# Patient Record
Sex: Male | Born: 1960 | Race: White | Hispanic: No | Marital: Married
Health system: Southern US, Community
[De-identification: ages and names within clinical notes are randomized; demographics above are authoritative.]

---

## 2018-07-24 ENCOUNTER — Other Ambulatory Visit (HOSPITAL_COMMUNITY): Payer: Self-pay | Admitting: Urology

## 2018-07-24 DIAGNOSIS — R972 Elevated prostate specific antigen [PSA]: Secondary | ICD-10-CM

## 2018-08-04 ENCOUNTER — Ambulatory Visit (HOSPITAL_COMMUNITY)
Admission: RE | Admit: 2018-08-04 | Discharge: 2018-08-04 | Disposition: A | Discharge status: Home / Self Care | Payer: BC Managed Care – PPO | Source: Ambulatory Visit | Attending: Urology | Admitting: Urology

## 2018-08-04 ENCOUNTER — Encounter (HOSPITAL_COMMUNITY): Payer: Self-pay

## 2018-08-04 DIAGNOSIS — R972 Elevated prostate specific antigen [PSA]: Secondary | ICD-10-CM

## 2018-08-04 LAB — POCT I-STAT CREATININE: CREATININE: 1.1 mg/dL (ref 0.61–1.24)

## 2018-08-04 MED ORDER — LIDOCAINE HCL URETHRAL/MUCOSAL 2 % EX GEL
CUTANEOUS | Status: AC
  Filled 2018-08-04: qty 30

## 2018-08-04 MED ORDER — GADOBUTROL 1 MMOL/ML IV SOLN: 10.0000 mL | Freq: Once | INTRAVENOUS | Status: DC | PRN

## 2018-08-04 MED ORDER — LIDOCAINE HCL URETHRAL/MUCOSAL 2 % EX GEL: 1.0000 "application " | Freq: Once | CUTANEOUS | Status: DC

## 2018-08-04 NOTE — Progress Notes (Signed)
Attempted patient for Endo Rectal Prostate MRI, we were unable to advance EndoRectal Coil where it needed to be for MRI. Per Dr Reche Dixonalbot, reschedule patient to be done [facility] without End[facility] Coil. Instructions were given to patient and patients wife.  Please call patient with new appointment,  BHJ

## 2018-08-05 ENCOUNTER — Inpatient Hospital Stay (HOSPITAL_COMMUNITY)
Admission: RE | Admit: 2018-08-05 | Discharge: 2018-08-05 | Disposition: A | Discharge status: Home / Self Care | Payer: Self-pay | Source: Ambulatory Visit | Attending: Urology | Admitting: Urology

## 2018-08-22 ENCOUNTER — Ambulatory Visit (HOSPITAL_COMMUNITY)
Admission: RE | Admit: 2018-08-22 | Discharge: 2018-08-22 | Disposition: A | Discharge status: Home / Self Care | Payer: BC Managed Care – PPO | Source: Ambulatory Visit | Attending: Urology | Admitting: Urology

## 2018-08-22 DIAGNOSIS — R972 Elevated prostate specific antigen [PSA]: Secondary | ICD-10-CM | POA: Diagnosis not present

## 2018-08-22 DIAGNOSIS — N429 Disorder of prostate, unspecified: Secondary | ICD-10-CM | POA: Diagnosis not present

## 2018-08-22 MED ORDER — GADOBUTROL 1 MMOL/ML IV SOLN
10.0000 mL | Freq: Once | INTRAVENOUS | Status: AC | PRN
  Administered 2018-08-22: 9 mL via INTRAVENOUS

## 2018-08-22 MED ORDER — LIDOCAINE HCL URETHRAL/MUCOSAL 2 % EX GEL
CUTANEOUS | Status: AC
  Filled 2018-08-22: qty 30

## 2021-07-06 ENCOUNTER — Other Ambulatory Visit: Payer: Self-pay | Admitting: Urology

## 2021-07-06 DIAGNOSIS — R972 Elevated prostate specific antigen [PSA]: Secondary | ICD-10-CM

## 2021-07-29 ENCOUNTER — Ambulatory Visit
Admission: RE | Admit: 2021-07-29 | Discharge: 2021-07-29 | Disposition: A | Discharge status: Home / Self Care | Payer: BC Managed Care – PPO | Source: Ambulatory Visit | Attending: Urology | Admitting: Urology

## 2021-07-29 ENCOUNTER — Other Ambulatory Visit: Payer: Self-pay

## 2021-07-29 DIAGNOSIS — R972 Elevated prostate specific antigen [PSA]: Secondary | ICD-10-CM

## 2021-07-29 MED ORDER — GADOBENATE DIMEGLUMINE 529 MG/ML IV SOLN
17.0000 mL | Freq: Once | INTRAVENOUS | Status: AC | PRN
  Administered 2021-07-29: 17 mL via INTRAVENOUS

## 2022-06-16 IMAGING — MR MR PROSTATE WO/W CM
12 series · 48 of 48 positions shown · IV contrast (multihance)
Comparison: Imaging from August 22, 2018.

CLINICAL DATA: A 60-year-old male with history of elevated PSA of
7.5. Previous biopsy reportedly in 5593 showing no evidence of
neoplasm with post inflammatory changes.

EXAM:
MR PROSTATE WITHOUT AND WITH CONTRAST
TECHNIQUE: Multiplanar multisequence MRI images were obtained of the pelvis
centered about the prostate. Pre and post contrast images were
obtained.
CONTRAST:  17mL MULTIHANCE GADOBENATE DIMEGLUMINE 529 MG/ML IV SOLN

[Series 3: T2 · coronal · 3.0mm · 0.56mm/px · 1 of 25 slices shown (1 of 3)]
[im 1/25]
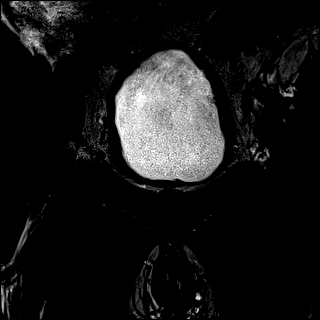

[Series 4: T1 · axial · 5.0mm · 1.25mm/px · 1 of 88 slices shown]
[im 1/88]
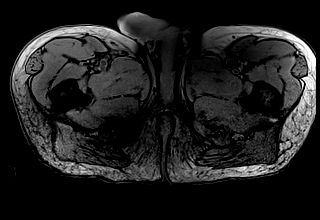

[Series 5: DWI · axial · 3.0mm · 1.75mm/px · 1 of 75 slices shown (1 of 3)]
[im 1/75]
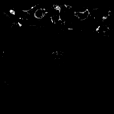

[Series 6: DWI · axial · 3.0mm · 1.75mm/px · 1 of 25 slices shown (2 of 3)]
[im 1/25]
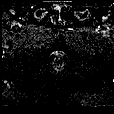

[Series 7: DWI · axial · 3.0mm · 1.75mm/px · 1 of 24 slices shown (3 of 3)]
[im 1/24]
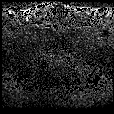

[Series 8: T2 · axial · 3.0mm · 0.56mm/px · 1 of 23 slices shown (2 of 3)]
[im 1/23]
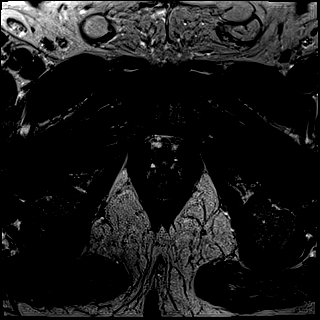

[Series 9: T2 · axial · 1.0mm · 1.04mm/px · z∈[-17,+54]mm · 2 of 72 slices shown (3 of 3)]
[im 1/72]
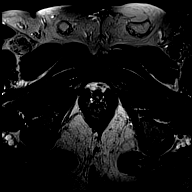
[im 72/72]
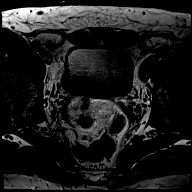

[Series 10: pre t1_twist_tra_dyn · axial · non-contrast · 3.5mm · 0.83mm/px · 1 of 20 slices shown]
[im 1/20]
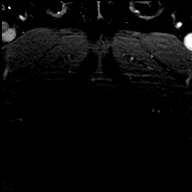

[Series 11: post t1_twist_tra_dyn-copy center · axial · non-contrast · 3.5mm · 0.83mm/px · z∈[-15,+52]mm · 17 of 600 slices shown]
[im 1/600]
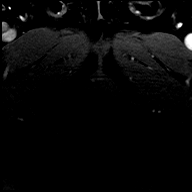
[im 38/600]
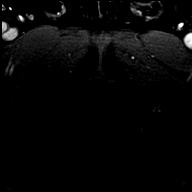
[im 75/600]
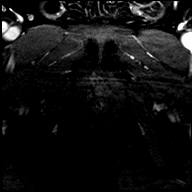
[im 113/600]
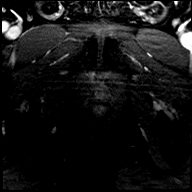
[im 150/600]
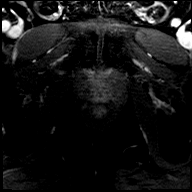
[im 188/600]
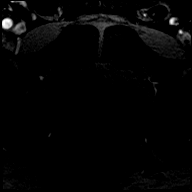
[im 225/600]
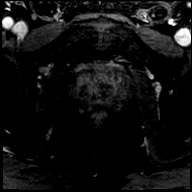
[im 263/600]
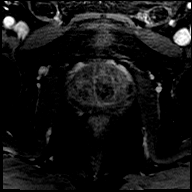
[im 300/600]
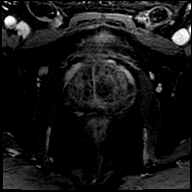
[im 337/600]
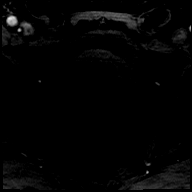
[im 375/600]
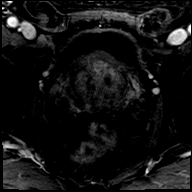
[im 412/600]
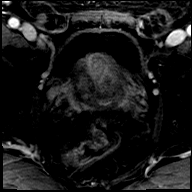
[im 450/600]
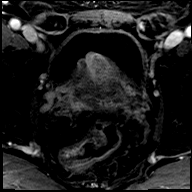
[im 487/600]
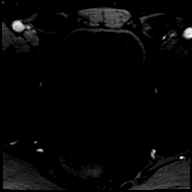
[im 525/600]
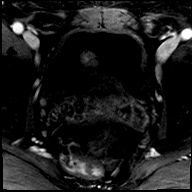
[im 562/600]
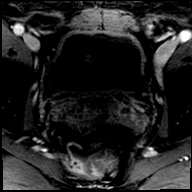
[im 600/600]
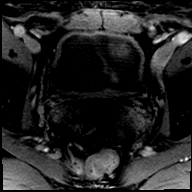

[Series 12: post t1_twist_tra_dyn-copy cent_sub · axial · 3.5mm · 0.83mm/px · z∈[-15,+52]mm · 16 of 576 slices shown]
[im 1/576]
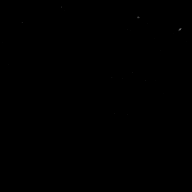
[im 39/576]
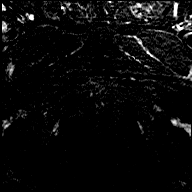
[im 77/576]
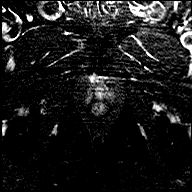
[im 116/576]
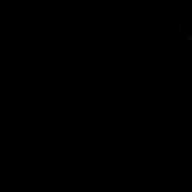
[im 154/576]
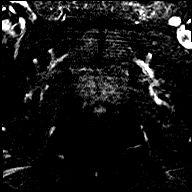
[im 192/576]
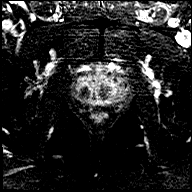
[im 231/576]
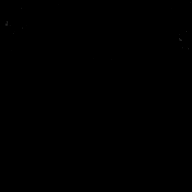
[im 269/576]
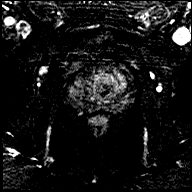
[im 307/576]
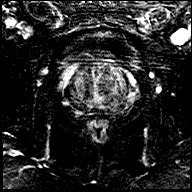
[im 346/576]
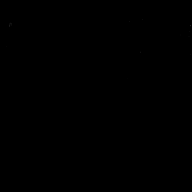
[im 384/576]
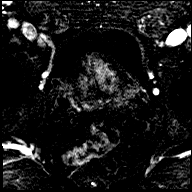
[im 422/576]
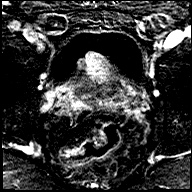
[im 461/576]
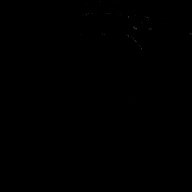
[im 499/576]
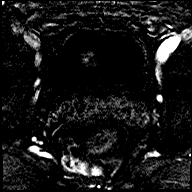
[im 537/576]
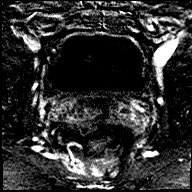
[im 576/576]
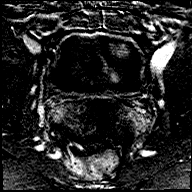

[Series 13: t1_vibe_dixon_tra_f · axial · 2.5mm · 1.14mm/px · z∈[-63,+174]mm · 3 of 96 slices shown]
[im 1/96]
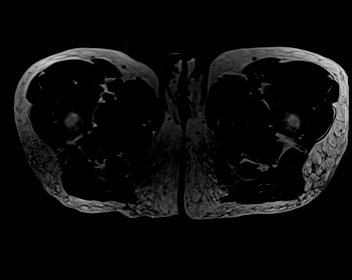
[im 48/96]
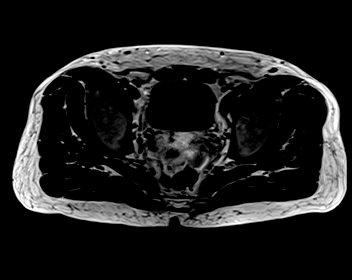
[im 96/96]
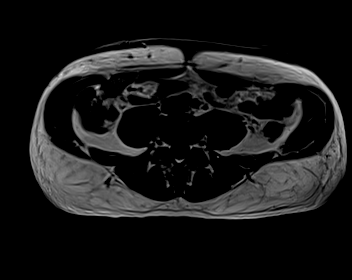

[Series 14: t1_vibe_dixon_tra_w · axial · 2.5mm · 1.14mm/px · z∈[-63,+174]mm · 3 of 96 slices shown]
[im 1/96]
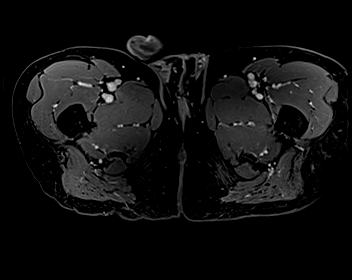
[im 48/96]
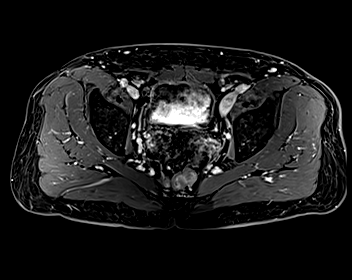
[im 96/96]
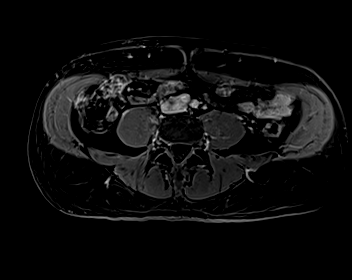

[48 of 48 positions shown; findings below may reference images not displayed]

FINDINGS: Prostate:

Transitional zone: Evidence of BPH.  No visible high-risk lesion.

Peripheral zone: Linear and wedge-shaped areas of T2 hypointensity
without focal restricted diffusion to indicate high-risk lesion.

Volume: 5.7 x 4.4 x 5.8 (volume = 76 cc) cm

PSA density: 0.098 ng/mL

Transcapsular spread:  Absent

Seminal vesicle involvement: Absent

Neurovascular bundle involvement: Absent

Pelvic adenopathy: Absent

Bone metastasis: Absent

Other findings: Colonic diverticulosis. Signs of small fat
containing bilateral inguinal hernias.
IMPRESSION: No signs of high-risk lesion. Overall assessment PIRADS category 2
with changes of BPH and prostatitis.

## 2024-01-28 ENCOUNTER — Other Ambulatory Visit: Payer: Self-pay | Admitting: Urology

## 2024-01-28 DIAGNOSIS — R972 Elevated prostate specific antigen [PSA]: Secondary | ICD-10-CM

## 2024-03-04 ENCOUNTER — Encounter: Payer: Self-pay | Admitting: Urology

## 2024-03-10 ENCOUNTER — Ambulatory Visit
Admission: RE | Admit: 2024-03-10 | Discharge: 2024-03-10 | Disposition: A | Discharge status: Home / Self Care | Payer: Self-pay | Source: Ambulatory Visit | Attending: Urology | Admitting: Urology

## 2024-03-10 DIAGNOSIS — R972 Elevated prostate specific antigen [PSA]: Secondary | ICD-10-CM

## 2024-03-10 MED ORDER — GADOPICLENOL 0.5 MMOL/ML IV SOLN
9.0000 mL | Freq: Once | INTRAVENOUS | Status: AC | PRN
  Administered 2024-03-10: 9 mL via INTRAVENOUS
